# Patient Record
Sex: Female | Born: 1935 | Race: White | Hispanic: No | Smoking: Never smoker
Health system: Southern US, Community
[De-identification: ages and names within clinical notes are randomized; demographics above are authoritative.]

## PROBLEM LIST (undated history)

## (undated) DIAGNOSIS — I341 Nonrheumatic mitral (valve) prolapse: Secondary | ICD-10-CM

## (undated) DIAGNOSIS — D649 Anemia, unspecified: Secondary | ICD-10-CM

## (undated) DIAGNOSIS — M1990 Unspecified osteoarthritis, unspecified site: Secondary | ICD-10-CM

## (undated) DIAGNOSIS — I499 Cardiac arrhythmia, unspecified: Secondary | ICD-10-CM

## (undated) HISTORY — PX: TONSILLECTOMY: SUR1361

## (undated) HISTORY — PX: TUBAL LIGATION: SHX77

## (undated) HISTORY — PX: APPENDECTOMY: SHX54

## (undated) HISTORY — PX: BUNIONECTOMY: SHX129

## (undated) HISTORY — PX: EYE SURGERY: SHX253

## (undated) HISTORY — PX: TRANSTHORACIC ECHOCARDIOGRAM: SHX275

---

## 2017-09-30 ENCOUNTER — Emergency Department (HOSPITAL_COMMUNITY)
Admission: EM | Admit: 2017-09-30 | Discharge: 2017-10-01 | Disposition: A | Discharge status: Home / Self Care | Payer: Medicare Other | Attending: Emergency Medicine | Admitting: Emergency Medicine

## 2017-09-30 ENCOUNTER — Other Ambulatory Visit: Payer: Self-pay

## 2017-09-30 DIAGNOSIS — R748 Abnormal levels of other serum enzymes: Secondary | ICD-10-CM | POA: Insufficient documentation

## 2017-09-30 DIAGNOSIS — R3129 Other microscopic hematuria: Secondary | ICD-10-CM | POA: Diagnosis not present

## 2017-09-30 DIAGNOSIS — R1011 Right upper quadrant pain: Secondary | ICD-10-CM

## 2017-09-30 DIAGNOSIS — Z79899 Other long term (current) drug therapy: Secondary | ICD-10-CM | POA: Diagnosis not present

## 2017-09-30 LAB — CBC
HEMATOCRIT: 38.6 % (ref 36.0–46.0)
HEMOGLOBIN: 12.5 g/dL (ref 12.0–15.0)
MCH: 29.6 pg (ref 26.0–34.0)
MCHC: 32.4 g/dL (ref 30.0–36.0)
MCV: 91.3 fL (ref 78.0–100.0)
Platelets: 333 10*3/uL (ref 150–400)
RBC: 4.23 MIL/uL (ref 3.87–5.11)
RDW: 13.7 % (ref 11.5–15.5)
WBC: 8 10*3/uL (ref 4.0–10.5)

## 2017-09-30 MED ORDER — ONDANSETRON 4 MG PO TBDP
4.0000 mg | ORAL_TABLET | Freq: Once | ORAL | Status: AC | PRN
  Administered 2017-09-30: 4 mg via ORAL
  Filled 2017-09-30: qty 1

## 2017-09-30 NOTE — ED Triage Notes (Signed)
Patient c/o abdominal pain, N/V since this evening. Diffuse abdominal pain, last BM today.

## 2017-09-30 NOTE — ED Notes (Signed)
Pt unable to void at this time. 

## 2017-10-01 ENCOUNTER — Emergency Department (HOSPITAL_COMMUNITY): Payer: Medicare Other

## 2017-10-01 DIAGNOSIS — R1011 Right upper quadrant pain: Secondary | ICD-10-CM | POA: Diagnosis not present

## 2017-10-01 LAB — URINALYSIS, ROUTINE W REFLEX MICROSCOPIC
BILIRUBIN URINE: NEGATIVE
Bacteria, UA: NONE SEEN
Glucose, UA: NEGATIVE mg/dL
Ketones, ur: 5 mg/dL — AB
LEUKOCYTES UA: NEGATIVE
Nitrite: NEGATIVE
Protein, ur: 30 mg/dL — AB
Specific Gravity, Urine: 1.023 (ref 1.005–1.030)
pH: 5 (ref 5.0–8.0)

## 2017-10-01 LAB — COMPREHENSIVE METABOLIC PANEL
ALBUMIN: 4.1 g/dL (ref 3.5–5.0)
ALT: 21 U/L (ref 0–44)
ANION GAP: 12 (ref 5–15)
AST: 34 U/L (ref 15–41)
Alkaline Phosphatase: 49 U/L (ref 38–126)
BUN: 22 mg/dL (ref 8–23)
CO2: 24 mmol/L (ref 22–32)
Calcium: 9.5 mg/dL (ref 8.9–10.3)
Chloride: 103 mmol/L (ref 98–111)
Creatinine, Ser: 0.8 mg/dL (ref 0.44–1.00)
GFR calc Af Amer: >60 mL/min (ref >60)
GFR calc non Af Amer: >60 mL/min (ref >60)
GLUCOSE: 132 mg/dL — AB (ref 70–99)
POTASSIUM: 3.5 mmol/L (ref 3.5–5.1)
SODIUM: 139 mmol/L (ref 135–145)
Total Bilirubin: 0.9 mg/dL (ref 0.3–1.2)
Total Protein: 7.1 g/dL (ref 6.5–8.1)

## 2017-10-01 LAB — LIPASE, BLOOD: Lipase: 70 U/L — ABNORMAL HIGH (ref 11–51)

## 2017-10-01 MED ORDER — TRAMADOL HCL 50 MG PO TABS: 50.0000 mg | ORAL_TABLET | Freq: Four times a day (QID) | ORAL | 0 refills | Status: AC | PRN

## 2017-10-01 MED ORDER — METOPROLOL TARTRATE 5 MG/5ML IV SOLN
2.5000 mg | Freq: Once | INTRAVENOUS | Status: AC
  Administered 2017-10-01: 2.5 mg via INTRAVENOUS
  Filled 2017-10-01: qty 5

## 2017-10-01 MED ORDER — SODIUM CHLORIDE 0.9 % IV BOLUS
1000.0000 mL | Freq: Once | INTRAVENOUS | Status: AC
  Administered 2017-10-01: 1000 mL via INTRAVENOUS

## 2017-10-01 MED ORDER — HYDROCODONE-ACETAMINOPHEN 5-325 MG PO TABS: 1.0000 | ORAL_TABLET | ORAL | 0 refills | Status: AC | PRN

## 2017-10-01 MED ORDER — MORPHINE SULFATE (PF) 4 MG/ML IV SOLN
2.0000 mg | INTRAVENOUS | Status: DC | PRN
  Administered 2017-10-01 (×2): 2 mg via INTRAVENOUS
  Filled 2017-10-01 (×2): qty 1

## 2017-10-01 MED ORDER — ONDANSETRON HCL 4 MG PO TABS: 4.0000 mg | ORAL_TABLET | Freq: Four times a day (QID) | ORAL | 0 refills | Status: AC | PRN

## 2017-10-01 MED ORDER — TRAMADOL HCL 50 MG PO TABS: 50.0000 mg | ORAL_TABLET | Freq: Four times a day (QID) | ORAL | 0 refills | Status: DC | PRN

## 2017-10-01 NOTE — ED Notes (Signed)
Patient's daughter came up to the information desk stating patient's pain is now making her very dizzy. Let patient know that she should be next to get a room.

## 2017-10-01 NOTE — Discharge Instructions (Addendum)
Your ultrasound did not show any evidence of gallstones. It is possible that your pain was from a gallstone that passed, or from a gallbladder that is not working properly. Please follow up with the gastroenterologist for further evaluation. In the mean time, stay on a low fat diet. Return to the Emergency Department if you are having pain or nausea which are not being adequately controlled by the medications, or if you start running a fever.  Follow up with your doctor at home to evaluate the blood in your urine.

## 2017-10-01 NOTE — ED Notes (Signed)
ED Provider at bedside. 

## 2017-10-01 NOTE — ED Provider Notes (Signed)
[facility] EMERGENCY DEPARTMENT Provider Note   CSN: [id number] Arrival date & time: 09/30/17  2323     History   Chief Complaint Chief Complaint  Patient presents with  . Abdominal Pain    HPI Yolanda Hester is a 84 y.o. female.  The history is provided by the patient.  She has a history of mitral valve prolapse and possible history of gallbladder disease and comes in with generalized abdominal pain which started this evening after eating chips and pimento cheese.  Pain does radiate to the back.  She rates it at 9/10.  There is associated nausea and she has vomited twice.  Pain has subsided, but seems to be coming back now.  She had a similar episode in December and was supposed to have ultrasound done, but it has not been done to this point.  She denies any prior problems with fatty food intolerance.  Tonight, she has not noticed anything that makes pain better or worse.  She denies fever or chills.  She denies urinary difficulty.  She denies constipation or diarrhea.  No past medical history on file.  There are no active problems to display for this patient.   ** The histories are not reviewed yet. Please review them in the "History" navigator section and refresh this SmartLink.   OB History   None      Home Medications    Prior to Admission medications   Medication Sig Start Date End Date Taking? Authorizing Provider  metoprolol tartrate (LOPRESSOR) 25 MG tablet Take 12.5 mg by mouth every evening.    [provider]    Family History No family history on file.  Social History Social History   Tobacco Use  . Smoking status: Not on file  Substance Use Topics  . Alcohol use: Not on file  . Drug use: Not on file     Allergies   Aspirin; Codeine; Nsaids; and Prednisone   Review of Systems Review of Systems  All other systems reviewed and are negative.    Physical Exam Updated Vital Signs BP (!) 144/58   Pulse (!) 53    Temp 98.7 F (37.1 C) (Oral)   Resp (!) 22   Ht 5' 3.5" (1.613 m)   Wt 50.8 kg (112 lb)   SpO2 99%   BMI 19.53 kg/m   Physical Exam  Nursing note and vitals reviewed.  84 year old female, resting comfortably and in no acute distress. Vital signs are significant for elevated systolic blood pressure and slightly slow heart rate. Oxygen saturation is 99%, which is normal. Head is normocephalic and atraumatic. PERRLA, EOMI. Oropharynx is clear. Neck is nontender and supple without adenopathy or JVD. Back is nontender and there is no CVA tenderness. Lungs are clear without rales, wheezes, or rhonchi. Chest is nontender. Heart has regular rate and rhythm without murmur. Abdomen is soft, flat, with moderate right upper quadrant tenderness.  Negative Murphy sign.  There is no rebound or guarding.  There are no masses or hepatosplenomegaly and peristalsis is hypoactive. Extremities have no cyanosis or edema, full range of motion is present. Skin is warm and dry without rash. Neurologic: Mental status is normal, cranial nerves are intact, there are no motor or sensory deficits.  ED Treatments / Results  Labs (all labs ordered are listed, but only abnormal results are displayed) Labs Reviewed  LIPASE, BLOOD - Abnormal; Notable for the following components:      Result Value   Lipase  70 (*)    All other components within normal limits  COMPREHENSIVE METABOLIC PANEL - Abnormal; Notable for the following components:   Glucose, Bld 132 (*)    All other components within normal limits  URINALYSIS, ROUTINE W REFLEX MICROSCOPIC - Abnormal; Notable for the following components:   Hgb urine dipstick LARGE (*)    Ketones, ur 5 (*)    Protein, ur 30 (*)    All other components within normal limits  CBC   Radiology [place] Abdomen Complete  Result Date: 10/01/2017 CLINICAL DATA:  84 year old female with abdominal pain, nausea vomiting. EXAM: ABDOMEN ULTRASOUND COMPLETE COMPARISON:  None. FINDINGS:  Gallbladder: No gallstones or wall thickening visualized. No sonographic Murphy sign noted by sonographer. Common bile duct: Diameter: 4 mm Liver: Unremarkable as visualized. Portal vein is patent on color Doppler imaging with normal direction of blood flow towards the liver. IVC: No abnormality visualized. Pancreas: Visualized portion unremarkable. Spleen: Size and appearance within normal limits. Right Kidney: Length: 9.0 cm. Normal echogenicity. No hydronephrosis or shadowing stone. Left Kidney: Length: 9.0 cm. Normal echogenicity. No hydronephrosis or shadowing stone. Abdominal aorta: Ectatic aorta measuring 2.3 cm in diameter. There is atherosclerotic calcification of the abdominal aorta. No aneurysmal dilatation. Other findings: Small perihepatic free fluid. IMPRESSION: 1. Small perihepatic free fluid 2. No gallstone. 3.  Aortic Atherosclerosis (ICD10-I70.0). Electronically Signed   By: Elgie CollardArash  Radparvar M.D.   On: 10/01/2017 04:31    Procedures Procedures   Medications Ordered in ED Medications  ondansetron (ZOFRAN-ODT) disintegrating tablet 4 mg (4 mg Oral Given 09/30/17 2338)     Initial Impression / Assessment and Plan / ED Course  I have reviewed the triage vital signs and the nursing notes.  Pertinent labs & imaging results that were available during my care of the patient were reviewed by me and considered in my medical decision making (see chart for details).  Abdominal pain strongly suggestive of biliary colic-especially given similar episode 7 months ago.  Laboratory work-up does show mild elevation of lipase but with normal transaminases.  Old records are reviewed, and care everywhere does show markedly elevated lipase on March 20, 2017, which had fallen to 276 on January 2.  Mild to moderate elevation of transaminases was also present but with normal alkaline phosphatase.  Unfortunately, physician notes from this time are not found on care everywhere.  This is strongly suggestive  of gallstone pancreatitis.  She will be sent for gallbladder ultrasound.  Other labs do show microscopic hematuria, so will do complete abdominal ultrasound rather than limited.  She does relate prior history of microscopic hematuria which had resolved.  Urinalysis on March 20, 2017 did have moderate amount of blood with 5-10 RBCs.  Ultrasound does not show evidence of cholelithiasis.  With elevated lipase, pain might of been from a gallstone which spontaneously passed, also consider possibility of gallbladder dysfunction.  She will need further outpatient work-up.  She feels considerably better following morphine and ondansetron given in the ED.  She is referred to gastroenterology for further outpatient work-up-consider HIDA scan.  Hematuria can be worked up by her primary care physician when [place] to [place] in [place].  She is discharged with prescriptions for hydrocodone-acetaminophen and ondansetron, return precautions discussed.  Final Clinical Impressions(s) / ED Diagnoses   Final diagnoses:  RUQ pain  Elevated lipase  Microscopic hematuria    ED Discharge Orders        Ordered    HYDROcodone-acetaminophen (NORCO) 5-325 MG tablet  Every  4 hours PRN     10/01/17 0714    ondansetron (ZOFRAN) 4 MG tablet  Every 6 hours PRN     10/01/17 0714       Dione Booze, MD 10/01/17 [phone]

## 2017-10-05 ENCOUNTER — Other Ambulatory Visit (HOSPITAL_COMMUNITY): Payer: Self-pay | Admitting: Gastroenterology

## 2017-10-05 DIAGNOSIS — R1084 Generalized abdominal pain: Secondary | ICD-10-CM

## 2017-10-05 DIAGNOSIS — R112 Nausea with vomiting, unspecified: Secondary | ICD-10-CM

## 2017-10-17 ENCOUNTER — Ambulatory Visit (HOSPITAL_COMMUNITY)
Admission: RE | Admit: 2017-10-17 | Discharge: 2017-10-17 | Disposition: A | Discharge status: Home / Self Care | Payer: Medicare Other | Source: Ambulatory Visit | Attending: Gastroenterology | Admitting: Gastroenterology

## 2017-10-17 ENCOUNTER — Ambulatory Visit (HOSPITAL_COMMUNITY): Payer: Medicare Other

## 2017-10-17 DIAGNOSIS — R112 Nausea with vomiting, unspecified: Secondary | ICD-10-CM | POA: Diagnosis present

## 2017-10-17 MED ORDER — TECHNETIUM TC 99M MEBROFENIN IV KIT
5.0000 | PACK | Freq: Once | INTRAVENOUS | Status: AC | PRN
  Administered 2017-10-17: 5 via INTRAVENOUS

## 2017-10-17 MED ORDER — BARIUM SULFATE 2.1 % PO SUSP
ORAL | Status: AC
  Filled 2017-10-17: qty 2

## 2017-10-18 ENCOUNTER — Ambulatory Visit (HOSPITAL_COMMUNITY)
Admission: RE | Admit: 2017-10-18 | Discharge: 2017-10-18 | Disposition: A | Discharge status: Home / Self Care | Payer: Medicare Other | Source: Ambulatory Visit | Attending: Gastroenterology | Admitting: Gastroenterology

## 2017-10-18 ENCOUNTER — Encounter (HOSPITAL_COMMUNITY): Payer: Self-pay

## 2017-10-18 DIAGNOSIS — I700 Atherosclerosis of aorta: Secondary | ICD-10-CM | POA: Insufficient documentation

## 2017-10-18 DIAGNOSIS — R1084 Generalized abdominal pain: Secondary | ICD-10-CM | POA: Insufficient documentation

## 2017-10-18 MED ORDER — IOHEXOL 300 MG/ML  SOLN
100.0000 mL | Freq: Once | INTRAMUSCULAR | Status: AC | PRN
  Administered 2017-10-18: 100 mL via INTRAVENOUS

## 2017-10-19 ENCOUNTER — Ambulatory Visit: Payer: Self-pay | Admitting: Surgery

## 2017-10-19 NOTE — H&P (View-Only) (Signed)
Yolanda Hester Documented: 10/19/2017 10:49 AM Location: [facility] Patient #: [id number] DOB: 10/27/1932 Widowed / Language: English / Race: White Female  History of Present Illness (Emmit Oriley A. Peggy Monk MD; 10/19/2017 12:14 PM) Patient words: 84-year-old healthy woman referred for history of reported gallstone pancreatitis. This occurred in December 2018. She had been admitted to the hospital with a lipase of 42,000. Had a CT scan 12/31 (describes nonspecific periportal edema, distended gallbladder with minimal pericholecystic fluid, it notes a 2 mm gallstone and moderate biliary dilatation with a common bile duct measuring 10 mm. Pancreatic duct was prominent measuring 6 mm in the body and tail) as well as an MRCP 03/21/17 (no filling defect in the biliary ducts, common bile duct at that time was 8 mm in size but the pancreatic duct was normal in caliber. There was no cholelithiasis present on the MRCP at that time) without any filling defect or pancreatic mass noted.   She did not follow-up as her symptoms resolved very quickly, her lipase went down to 276 nearly overnight.   Apparently a couple weeks ago she started noting severe upper abdominal pain/ pressure associated with bloating, nausea and nonbloody nonbilious emesis. No fever Reported to the emergency department a couple weeks ago and labs were normal except for a lipase of 70. Ultrasound demonstrated no gallstones or biliary dilatation. She was then referred to see Dr. Brahmbhatt who she saw on July 18 with ongoing abdominal discomfort and nausea and vomiting. He recommended repeating CT scan as well as performing a HIDA scan. Also recommended stool softeners, she did take some Senna tea and [place]ley when she had a bowel movement much of her pain was resolved. She is referred here given her history of gallstone pancreatitis.   Ultrasound on July 14 shows small perihepatic free fluid, no gallstone, aortic  atherosclerosis. HIDA scan performed on July 30 shows normal gallbladder ejection fraction 65%. CT scan performed July 31 demonstrates mild intra-and extrahepatic biliary duct dilatation which appears increased from comparison ultrasound, common duct measured 8-10 mm compared to 4 mm on the ultrasound 2 weeks prior. No lesions identified. Gallbladder not distended, no gallstones evident, several very small liver cysts. Pancreatic duct dilated to 6 mm which extends to the ampullary region. No evident and clear lesion. No retroperitoneal or periportal lymphadenopathy; this is actually essentially the same as the measurements on her imaging in January.  She is visiting her daughter here from [place]. 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  The patient is a 84 year old female.   Past Surgical History (Armen Ferguson, CMA; 10/19/2017 10:49 AM) Appendectomy Cataract Surgery Bilateral. Foot Surgery Bilateral. Tonsillectomy  Diagnostic Studies History (Armen Ferguson, CMA; 10/19/2017 10:49 AM) Colonoscopy 1-5 years ago Mammogram within last year Pap Smear 1-5 years ago  Allergies (Armen Ferguson, CMA; 10/19/2017 10:51 AM) CODEINE ASPIRIN Hives. NSAIDs PREDNISONE Palpitations  Medication History (Armen Ferguson, CMA; 10/19/2017 10:54 AM) Metoprolol Succinate ER (25MG Tablet ER 24HR, Oral) Active. Biotin (1000MCG Tablet Chewable, Oral) Active. Centrum Silver (Oral) Active. Zofran (4MG Tablet, Oral) Active. Vitamin B-12 (1000MCG Tablet, Oral) Active. Multivitamin Adult (Oral) Active. Medications Reconciled  Social History (Armen Ferguson, CMA; 10/19/2017 10:49 AM) Alcohol use Moderate alcohol use. Caffeine use Coffee, Tea. No drug use Tobacco use Never smoker.  Family History (Armen Ferguson, CMA; 10/19/2017 10:49 AM) Arthritis Mother. Cerebrovascular Accident Father,  Mother. Heart Disease Mother. Heart disease in   female family member before age 65 Hypertension Mother.  Pregnancy / Birth History (Armen Ferguson, CMA; 10/19/2017 10:49 AM) Age at menarche 15 years. Age of menopause 46-50 Length (months) of breastfeeding 7-12 Maternal age 26-30 Para 2  Other Problems (Armen Ferguson, CMA; 10/19/2017 10:49 AM) Arthritis Diverticulosis Hypercholesterolemia Pancreatitis     Review of Systems (Julianah Marciel A. Orlo Brickle MD; 10/19/2017 12:11 PM) General Not Present- Appetite Loss, Chills, Fatigue, Fever, Night Sweats, Weight Gain and Weight Loss. Skin Not Present- Change in Wart/Mole, Dryness, Hives, Jaundice, New Lesions, Non-Healing Wounds, Rash and Ulcer. HEENT Not Present- Earache, Hearing Loss, Hoarseness, Nose Bleed, Oral Ulcers, Ringing in the Ears, Seasonal Allergies, Sinus Pain, Sore Throat, Visual Disturbances, Wears glasses/contact lenses and Yellow Eyes. Respiratory Not Present- Bloody sputum, Chronic Cough, Difficulty Breathing, Snoring and Wheezing. Breast Not Present- Breast Mass, Breast Pain, Nipple Discharge and Skin Changes. Cardiovascular Not Present- Chest Pain, Difficulty Breathing Lying Down, Leg Cramps, Palpitations, Rapid Heart Rate, Shortness of Breath and Swelling of Extremities. Gastrointestinal Not Present- Abdominal Pain, Bloating, Bloody Stool, Change in Bowel Habits, Chronic diarrhea, Constipation, Difficulty Swallowing, Excessive gas, Gets full quickly at meals, Hemorrhoids, Indigestion, Nausea, Rectal Pain and Vomiting. Female Genitourinary Not Present- Frequency, Nocturia, Painful Urination, Pelvic Pain and Urgency. Musculoskeletal Present- Joint Pain. Not Present- Back Pain, Joint Stiffness, Muscle Pain, Muscle Weakness and Swelling of Extremities. Neurological Not Present- Decreased Memory, Fainting, Headaches, Numbness, Seizures, Tingling, Tremor, Trouble walking and Weakness. Psychiatric Not Present- Anxiety, Bipolar,  Change in Sleep Pattern, Depression, Fearful and Frequent crying. Endocrine Present- Cold Intolerance and Heat Intolerance. Not Present- Excessive Hunger, Hair Changes, Hot flashes and New Diabetes. All other systems negative  Vitals (Armen Ferguson CMA; 10/19/2017 10:50 AM) 10/19/2017 10:49 AM Weight: 104.5 lb Height: 63.5in Body Surface Area: 1.48 m Body Mass Index: 18.22 kg/m  Temp.: 97.6F  Pulse: 85 (Regular)  P.OX: 99% (Room air) BP: 130/86 (Sitting, Left Arm, Standard)      Physical Exam (Charliene Inoue A. Mackinze Criado MD; 10/19/2017 12:11 PM)  The physical exam findings are as follows: Note:Gen: alert and well appearing Eye: extraocular motion intact, no scleral icterus ENT: moist mucus membranes, dentition intact Neck: no mass or thyromegaly Chest: unlabored respirations, symmetrical air entry, clear bilaterally CV: regular rate and rhythm, no pedal edema Abdomen: soft, nontender, nondistended. No mass or organomegaly. Well-healed right lower quadrant scar from prior appendectomy, no visible scar the umbilicus from tubal ligation. MSK: strength symmetrical throughout, no deformity Neuro: grossly intact, normal gait Psych: normal mood and affect, appropriate insight Skin: warm and dry, no rash or lesion on limited exam    Assessment & Plan (Lani Mendiola A. Jolan Mealor MD; 10/19/2017 12:13 PM)  GALLSTONE PANCREATITIS (K85.10) Story: At this point a remote history. Unclear if her recent bout of pain was related to biliary colic or constipation. However given the history of gallstones and pancreatitis, I recommend proceeding with laparoscopic cholecystectomy with possible cholangiogram. Discussed risks of surgery including bleeding, pain, scarring, intraabdominal injury specifically to the common bile duct and sequelae, conversion to open surgery, failure to resolve symptoms, blood clots/ pulmonary embolus, heart attack, pneumonia, stroke, death. If she continues to have symptoms post  cholecystectomy, given the mild dilation or pancreatic duct, it may be worth considering ERCP/EUS to evaluate for an occult papillary lesion although given the stability of the ductal dilatation over the last 8 months and the very acute onset and recovery of her pancreatitis last year, I think that that is less likely. Questions welcomed and answered to patient's satisfaction.   She is planned to return to [place] where she lives in about 3 weeks. I discussed with her that if she would prefer to arrange to see a surgeon there and so that she could follow up reliably that that might be the best course of action. However she would like to have surgery here while she is staying with her daughter.  

## 2017-10-19 NOTE — H&P (Signed)
Ruben Gottron Nez Documented: 10/19/2017 10:49 AM Location: [facility] Patient #: [id number] DOB: June 04, 1932 Widowed / Language: [place] / Race: White Female  History of Present Illness (Benen Weida A. Fredricka Bonine MD; 10/19/2017 12:14 PM) Patient words: 84 year old healthy woman referred for history of reported gallstone pancreatitis. This occurred in December 2018. She had been admitted to the hospital with a lipase of 42,000. Had a CT scan 12/31 (describes nonspecific periportal edema, distended gallbladder with minimal pericholecystic fluid, it notes a 2 mm gallstone and moderate biliary dilatation with a common bile duct measuring 10 mm. Pancreatic duct was prominent measuring 6 mm in the body and tail) as well as an MRCP 03/21/17 (no filling defect in the biliary ducts, common bile duct at that time was 8 mm in size but the pancreatic duct was normal in caliber. There was no cholelithiasis present on the MRCP at that time) without any filling defect or pancreatic mass noted.   She did not follow-up as her symptoms resolved very quickly, her lipase went down to 276 nearly overnight.   Apparently a couple weeks ago she started noting severe upper abdominal pain/ pressure associated with bloating, nausea and nonbloody nonbilious emesis. No fever Reported to the emergency department a couple weeks ago and labs were normal except for a lipase of 70. Ultrasound demonstrated no gallstones or biliary dilatation. She was then referred to see Dr. Levora Angel who she saw on July 18 with ongoing abdominal discomfort and nausea and vomiting. He recommended repeating CT scan as well as performing a HIDA scan. Also recommended stool softeners, she did take some Senna tea and Exley when she had a bowel movement much of her pain was resolved. She is referred here given her history of gallstone pancreatitis.   Ultrasound on July 14 shows small perihepatic free fluid, no gallstone, aortic  atherosclerosis. HIDA scan performed on July 30 shows normal gallbladder ejection fraction 65%. CT scan performed July 31 demonstrates mild intra-and extrahepatic biliary duct dilatation which appears increased from comparison ultrasound, common duct measured 8-10 mm compared to 4 mm on the ultrasound 2 weeks prior. No lesions identified. Gallbladder not distended, no gallstones evident, several very small liver cysts. Pancreatic duct dilated to 6 mm which extends to the ampullary region. No evident and clear lesion. No retroperitoneal or periportal lymphadenopathy; this is actually essentially the same as the measurements on her imaging in January.  She is visiting her daughter here from [place]. She placed the flute and her daughter is a Engineer, agricultural at [employer]; they play gigs for retirement communities in town.  The patient is a 84 year old female.   Past Surgical History Renee Ramus, CMA; 10/19/2017 10:49 AM) Appendectomy Cataract Surgery Bilateral. Foot Surgery Bilateral. Tonsillectomy  Diagnostic Studies History (Armen Ferguson, CMA; 10/19/2017 10:49 AM) Colonoscopy 1-5 years ago Mammogram within last year Pap Smear 1-5 years ago  Allergies Renee Ramus, CMA; 10/19/2017 10:51 AM) CODEINE ASPIRIN Hives. NSAIDs PREDNISONE Palpitations  Medication History (Armen Ferguson, CMA; 10/19/2017 10:54 AM) Metoprolol Succinate ER (25MG  Tablet ER 24HR, Oral) Active. Biotin ( Tablet Chewable, Oral) Active. Centrum Silver (Oral) Active. Zofran (4MG  Tablet, Oral) Active. Vitamin B-12 ( Tablet, Oral) Active. Multivitamin Adult (Oral) Active. Medications Reconciled  Social History Renee Ramus, CMA; 10/19/2017 10:49 AM) Alcohol use Moderate alcohol use. Caffeine use Coffee, Tea. No drug use Tobacco use Never smoker.  Family History Renee Ramus, CMA; 10/19/2017 10:49 AM) Arthritis Mother. Cerebrovascular Accident Father,  Mother. Heart Disease Mother. Heart disease in  female family member before age 29 Hypertension Mother.  Pregnancy / Birth History Renee Ramus, CMA; 10/19/2017 10:49 AM) Age at menarche 15 years. Age of menopause [age] Length (months) of breastfeeding 7-12 Maternal age 58-30 Para 2  Other Problems Renee Ramus, CMA; 10/19/2017 10:49 AM) Arthritis Diverticulosis Hypercholesterolemia Pancreatitis     Review of Systems (Zaiah Eckerson A. Fredricka Bonine MD; 10/19/2017 12:11 PM) General Not Present- Appetite Loss, Chills, Fatigue, Fever, Night Sweats, Weight Gain and Weight Loss. Skin Not Present- Change in Wart/Mole, Dryness, Hives, Jaundice, New Lesions, Non-Healing Wounds, Rash and Ulcer. HEENT Not Present- Earache, Hearing Loss, Hoarseness, Nose Bleed, Oral Ulcers, Ringing in the Ears, Seasonal Allergies, Sinus Pain, Sore Throat, Visual Disturbances, Wears glasses/contact lenses and Yellow Eyes. Respiratory Not Present- Bloody sputum, Chronic Cough, Difficulty Breathing, Snoring and Wheezing. Breast Not Present- Breast Mass, Breast Pain, Nipple Discharge and Skin Changes. Cardiovascular Not Present- Chest Pain, Difficulty Breathing Lying Down, Leg Cramps, Palpitations, Rapid Heart Rate, Shortness of Breath and Swelling of Extremities. Gastrointestinal Not Present- Abdominal Pain, Bloating, Bloody Stool, Change in Bowel Habits, Chronic diarrhea, Constipation, Difficulty Swallowing, Excessive gas, Gets full quickly at meals, Hemorrhoids, Indigestion, Nausea, Rectal Pain and Vomiting. Female Genitourinary Not Present- Frequency, Nocturia, Painful Urination, Pelvic Pain and Urgency. Musculoskeletal Present- Joint Pain. Not Present- Back Pain, Joint Stiffness, Muscle Pain, Muscle Weakness and Swelling of Extremities. Neurological Not Present- Decreased Memory, Fainting, Headaches, Numbness, Seizures, Tingling, Tremor, Trouble walking and Weakness. Psychiatric Not Present- Anxiety, Bipolar,  Change in Sleep Pattern, Depression, Fearful and Frequent crying. Endocrine Present- Cold Intolerance and Heat Intolerance. Not Present- Excessive Hunger, Hair Changes, Hot flashes and New Diabetes. All other systems negative  Vitals (Armen Ferguson CMA; 10/19/2017 10:50 AM) 10/19/2017 10:49 AM Weight: 104.5 lb Height: 63.5in Body Surface Area: 1.48 m Body Mass Index: 18.22 kg/m  Temp.: 97.45F  Pulse: 85 (Regular)  P.OX: 99% (Room air) BP: 130/86 (Sitting, Left Arm, Standard)      Physical Exam (Izac Faulkenberry A. Fredricka Bonine MD; 10/19/2017 12:11 PM)  The physical exam findings are as follows: Note:Gen: alert and well appearing Eye: extraocular motion intact, no scleral icterus ENT: moist mucus membranes, dentition intact Neck: no mass or thyromegaly Chest: unlabored respirations, symmetrical air entry, clear bilaterally CV: regular rate and rhythm, no pedal edema Abdomen: soft, nontender, nondistended. No mass or organomegaly. Well-healed right lower quadrant scar from prior appendectomy, no visible scar the umbilicus from tubal ligation. MSK: strength symmetrical throughout, no deformity Neuro: grossly intact, normal gait Psych: normal mood and affect, appropriate insight Skin: warm and dry, no rash or lesion on limited exam    Assessment & Plan (Denetta Fei A. Fredricka Bonine MD; 10/19/2017 12:13 PM)  GALLSTONE PANCREATITIS (K85.10) Story: At this point a remote history. Unclear if her recent bout of pain was related to biliary colic or constipation. However given the history of gallstones and pancreatitis, I recommend proceeding with laparoscopic cholecystectomy with possible cholangiogram. Discussed risks of surgery including bleeding, pain, scarring, intraabdominal injury specifically to the common bile duct and sequelae, conversion to open surgery, failure to resolve symptoms, blood clots/ pulmonary embolus, heart attack, pneumonia, stroke, death. If she continues to have symptoms post  cholecystectomy, given the mild dilation or pancreatic duct, it may be worth considering ERCP/EUS to evaluate for an occult papillary lesion although given the stability of the ductal dilatation over the last 8 months and the very acute onset and recovery of her pancreatitis last year, I think that that is less likely. Questions welcomed and answered to patient's satisfaction.  She is planned to return to [place] where she lives in about 3 weeks. I discussed with her that if she would prefer to arrange to see a surgeon there and so that she could follow up reliably that that might be the best course of action. However she would like to have surgery here while she is staying with her daughter.

## 2017-10-27 ENCOUNTER — Other Ambulatory Visit: Payer: Self-pay

## 2017-10-27 ENCOUNTER — Encounter (HOSPITAL_COMMUNITY): Payer: Self-pay

## 2017-10-27 ENCOUNTER — Encounter (HOSPITAL_COMMUNITY)
Admission: RE | Admit: 2017-10-27 | Discharge: 2017-10-27 | Disposition: A | Discharge status: Home / Self Care | Payer: Medicare Other | Source: Ambulatory Visit | Attending: Surgery | Admitting: Surgery

## 2017-10-27 DIAGNOSIS — Z01812 Encounter for preprocedural laboratory examination: Secondary | ICD-10-CM | POA: Diagnosis present

## 2017-10-27 HISTORY — DX: Anemia, unspecified: D64.9

## 2017-10-27 HISTORY — DX: Unspecified osteoarthritis, unspecified site: M19.90

## 2017-10-27 HISTORY — DX: Cardiac arrhythmia, unspecified: I49.9

## 2017-10-27 HISTORY — DX: Nonrheumatic mitral (valve) prolapse: I34.1

## 2017-10-27 LAB — CBC WITH DIFFERENTIAL/PLATELET
ABS IMMATURE GRANULOCYTES: 0 10*3/uL (ref 0.0–0.1)
BASOS PCT: 1 %
Basophils Absolute: 0.1 10*3/uL (ref 0.0–0.1)
Eosinophils Absolute: 0.1 10*3/uL (ref 0.0–0.7)
Eosinophils Relative: 3 %
HEMATOCRIT: 37.5 % (ref 36.0–46.0)
Hemoglobin: 11.9 g/dL — ABNORMAL LOW (ref 12.0–15.0)
IMMATURE GRANULOCYTES: 0 %
LYMPHS ABS: 1.5 10*3/uL (ref 0.7–4.0)
Lymphocytes Relative: 32 %
MCH: 29.8 pg (ref 26.0–34.0)
MCHC: 31.7 g/dL (ref 30.0–36.0)
MCV: 93.8 fL (ref 78.0–100.0)
Monocytes Absolute: 0.6 10*3/uL (ref 0.1–1.0)
Monocytes Relative: 13 %
NEUTROS ABS: 2.3 10*3/uL (ref 1.7–7.7)
NEUTROS PCT: 51 %
PLATELETS: 315 10*3/uL (ref 150–400)
RBC: 4 MIL/uL (ref 3.87–5.11)
RDW: 13.7 % (ref 11.5–15.5)
WBC: 4.6 10*3/uL (ref 4.0–10.5)

## 2017-10-27 LAB — COMPREHENSIVE METABOLIC PANEL
ALBUMIN: 3.9 g/dL (ref 3.5–5.0)
ALT: 18 U/L (ref 0–44)
AST: 31 U/L (ref 15–41)
Alkaline Phosphatase: 44 U/L (ref 38–126)
Anion gap: 6 (ref 5–15)
BUN: 15 mg/dL (ref 8–23)
CHLORIDE: 105 mmol/L (ref 98–111)
CO2: 29 mmol/L (ref 22–32)
CREATININE: 1.08 mg/dL — AB (ref 0.44–1.00)
Calcium: 9.5 mg/dL (ref 8.9–10.3)
GFR calc Af Amer: 54 mL/min — ABNORMAL LOW (ref >60)
GFR, EST NON AFRICAN AMERICAN: 46 mL/min — AB (ref >60)
GLUCOSE: 120 mg/dL — AB (ref 70–99)
Potassium: 3.9 mmol/L (ref 3.5–5.1)
Sodium: 140 mmol/L (ref 135–145)
Total Bilirubin: 0.8 mg/dL (ref 0.3–1.2)
Total Protein: 7 g/dL (ref 6.5–8.1)

## 2017-10-27 NOTE — Progress Notes (Signed)
No PCP in system   Pt. Lives in [place] and is here visiting daughter.  Requested EKG tracing from [facility] in [place] ,[place]  [facility] [place]   Dr. Radonna Rickerhomas Hong  Requested last office visit from Dr. Audley HoseHong

## 2017-10-27 NOTE — Pre-Procedure Instructions (Signed)
Yolanda Hester  10/27/2017      [facility] [place] [place] [address] Phone: [phone] Fax: [phone]    Your procedure is scheduled on Wednesday August 14.  Report to [facility] Admitting at 1:00 A.M.  Call this number if you have problems the morning of surgery:  [phone]   Remember:  Do not eat or drink after midnight.    Take these medicines the morning of surgery with A SIP OF WATER:   Metoprolol (lopressor) Ondansetron (Zofran) if needed Tramadol (ultram) if needed OR hydrocodone-acetaminophen (Norco) if needed  7 days prior to surgery STOP taking any Aspirin(unless otherwise instructed by your surgeon), Aleve, Naproxen, Ibuprofen, Motrin, Advil, Goody's, BC's, all herbal medications, fish oil, and all vitamins     Do not wear jewelry, make-up or nail polish.  Do not wear lotions, powders, or perfumes, or deodorant.  Do not shave 48 hours prior to surgery.  Men may shave face and neck.  Do not bring valuables to the hospital.  [facility] is not responsible for any belongings or valuables.  Contacts, dentures or bridgework may not be worn into surgery.  Leave your suitcase in the car.  After surgery it may be brought to your room.  For patients admitted to the hospital, discharge time will be determined by your treatment team.  Patients discharged the day of surgery will not be allowed to drive home.   Special instructions:    [facility]- Preparing For Surgery  Before surgery, you can play an important role. Because skin is not sterile, your skin needs to be as free of germs as possible. You can reduce the number of germs on your skin by washing with CHG (chlorahexidine gluconate) Soap before surgery.  CHG is an antiseptic cleaner which kills germs and bonds with the skin to continue killing germs even after washing.    Oral Hygiene is also important to reduce your  risk of infection.  Remember - BRUSH YOUR TEETH THE MORNING OF SURGERY WITH YOUR REGULAR TOOTHPASTE  Please do not use if you have an allergy to CHG or antibacterial soaps. If your skin becomes reddened/irritated stop using the CHG.  Do not shave (including legs and underarms) for at least 48 hours prior to first CHG shower. It is OK to shave your face.  Please follow these instructions carefully.   1. Shower the NIGHT BEFORE SURGERY and the MORNING OF SURGERY with CHG.   2. If you chose to wash your hair, wash your hair first as usual with your normal shampoo.  3. After you shampoo, rinse your hair and body thoroughly to remove the shampoo.  4. Use CHG as you would any other liquid soap. You can apply CHG directly to the skin and wash gently with a scrungie or a clean washcloth.   5. Apply the CHG Soap to your body ONLY FROM THE NECK DOWN.  Do not use on open wounds or open sores. Avoid contact with your eyes, ears, mouth and genitals (private parts). Wash Face and genitals (private parts)  with your normal soap.  6. Wash thoroughly, paying special attention to the area where your surgery will be performed.  7. Thoroughly rinse your body with warm water from the neck down.  8. DO NOT shower/wash with your normal soap after using and rinsing off the CHG Soap.  9. Pat yourself dry with a CLEAN TOWEL.  10. Wear CLEAN PAJAMAS to bed the night before surgery, wear comfortable clothes the morning of surgery  11. Place CLEAN SHEETS on your bed the night of your first shower and DO NOT SLEEP WITH PETS.    Day of Surgery:  Do not apply any deodorants/lotions.  Please wear clean clothes to the hospital/surgery center.   Remember to brush your teeth WITH YOUR REGULAR TOOTHPASTE.    Please read over the following fact sheets that you were given. Coughing and Deep Breathing and Surgical Site Infection Prevention

## 2017-10-27 NOTE — Progress Notes (Signed)
[facility] requested EKG tracing. Written report in Care Everywhere.

## 2017-10-30 NOTE — Progress Notes (Addendum)
Anesthesia Chart Review:  Case:  [id number] Date/Time:  11/01/17 1445   Procedure:  LAPAROSCOPIC CHOLECYSTECTOMY (N/A )   Anesthesia type:  General   Pre-op diagnosis:  Gallstone pancreatitis   Location:  [facility] OR ROOM 02 / [facility] OR   Surgeon:  Berna Bueonnor, Chelsea A, MD      DISCUSSION: Patient is an 84 year old female scheduled for the above procedure.  History includes   never smoker, MVP, palpitations, anemia, arthritis, gallstone pancreatitis 02/2017, cataract extraction (05/2017, 06/2017). She lives in [place], but is visiting daughter locally and chose to have surgery here.   Cardiologist is Radonna RickerHong, Thomas, MD with [facility] ([address], [place], [place] [phone] or [phone]). Last time seen ~ 03/2017. She believes she had an office visit, EKG, echo, and Holter monitor with one year follow-up recommended. She has had an ETT and nuclear stress test in the past, but think they may be > 3 years ago. Denied cardiac cath. She denied any history of afib.  She reports that she is very active. Since in [place], she continues to walk ~ 1-2 miles per day. When living in [place], she walks even faster on her treadmill. She denied chest pain, syncope, or significant SOB with this level of exercise. She denied edema.   No cardiology records received from initial requests made on 11/06/17. I was able to get more direct cardiology contact information from patient and will re-request records. She does not report any CV or CHF symptoms and has good exercise tolerance. If we don't get an EKG tracing from within the past year then she will need one done on the day of surgery.  ADDENDUM 10/31/17 12:30 PM: 03/20/17 EKG tracing received. Still awaiting other records. Reviewed available information with anesthesiologist Val EagleMoser, Christopher, MD. Without CV symptoms and good exercise tolerance would anticipate that she can proceed as planned, but anesthesiologist to evaluate on the day of surgery. I will also  follow-up record request.  ADDENDUM 11/01/17 12:32 PM: [facility]iology [facility] from [facility]. Last visit with Dr. Audley HoseHong was on 04/12/17 for palpitations and non-rheumatic fever MVP. He ordered an echo and Holter monitor (outlined below). She has MVP with moderate MR.    VS: BP 132/68   Pulse 84   Temp 36.5 C   Resp 20   Ht 5\' 3"  (1.6 m)   Wt 47.9 kg   SpO2 100%   BMI 18.71 kg/m   PROVIDERS: Radonna RickerHong, Thomas, MD is EP cardiologist [facility]). She has only seen him once because tragically her previous cardiologist Blossom HoopsMark, Hausknecht, MD at the same practice was murdered in 2018.  LABS: Labs reviewed: Acceptable for surgery. (all labs ordered are listed, but only abnormal results are displayed)  Labs Reviewed  CBC WITH DIFFERENTIAL/PLATELET - Abnormal; Notable for the following components:      Result Value   Hemoglobin 11.9 (*)    All other components within normal limits  COMPREHENSIVE METABOLIC PANEL - Abnormal; Notable for the following components:   Glucose, Bld 120 (*)    Creatinine, Ser 1.08 (*)    GFR calc non Af Amer 46 (*)    GFR calc Af Amer 54 (*)    All other components within normal limits    EKG: 03/20/17 ([facility]): Result Narrative: Result Narrative  Ventricular Rate 99 BPM Atrial Rate 99 BPM P-R Interval 202 ms QRS Duration 90 ms Q-T Interval 358 ms QTC Calculation(Bazett) 459 ms P Axis 86 degrees R Axis 69  degrees T Axis 69 degrees  Sinus rhythm with Premature atrial complexes Nonspecific ST abnormality Abnormal ECG When compared with ECG of 19-Dec-2014 08:44, Premature atrial complexes are now Present Confirmed by Sherley BoundsBIRNBAUM, MD, Rich ReiningYOCHAI [phone]([id number]) on 03/21/2017 6:31:30 AM    CV:  Echo 04/17/17 [facility]([place] [facility]): Left ventricular ejection fraction is normal and estimated at 55%. Impaired relaxation pattern observed (grade 1). Right ventricular global systolic function is normal. Left atrium mildly  dilated. Mitral valve prolapse seen. Moderate mitral regurgitation.  24 hour Holter Monitor 04/12/17 [facility]([place] [facility]):  Normal sinus rhythm, 51 to 95 bpm.  Rare PVCs.  Frequent premature supraventricular contractions.  4 runs of SVT, longest 13 beats.    Nuclear stress test [facility]([place] [facility]): Normal myocardial perfusion study.  Echo, event monitor, and stress test (if within five years) requested from [facility].   Past Medical History:  Diagnosis Date  . Anemia   . Arthritis   . Dysrhythmia    palpatations  . Mitral valve prolapse     Past Surgical History:  Procedure Laterality Date  . APPENDECTOMY    . BUNIONECTOMY Bilateral   . EYE SURGERY Bilateral    Bilateral cataracts  . TONSILLECTOMY    . TUBAL LIGATION      MEDICATIONS: . HYDROcodone-acetaminophen (NORCO) 5-325 MG tablet  . metoprolol succinate (TOPROL-XL) 25 MG 24 hr tablet  . ondansetron (ZOFRAN) 4 MG tablet  . traMADol (ULTRAM) 50 MG tablet  . Biotin 1000 MCG CHEW  . metoprolol tartrate (LOPRESSOR) 25 MG tablet  . Multiple Vitamin (MULTIVITAMIN WITH MINERALS) TABS tablet   No current facility-administered medications for this encounter.     Velna Ochsllison Heaton Sarin, PA-C [facility]/Anest[facility]iology Phone [phone] 10/30/2017 5:58 PM

## 2017-10-31 MED ORDER — BUPIVACAINE LIPOSOME 1.3 % IJ SUSP
20.0000 mL | INTRAMUSCULAR | Status: AC
  Administered 2017-11-01: 20 mL
  Filled 2017-10-31: qty 20

## 2017-11-01 ENCOUNTER — Ambulatory Visit (HOSPITAL_COMMUNITY): Payer: Medicare Other | Admitting: Vascular Surgery

## 2017-11-01 ENCOUNTER — Ambulatory Visit (HOSPITAL_COMMUNITY)
Admission: RE | Admit: 2017-11-01 | Discharge: 2017-11-01 | Disposition: A | Discharge status: Home / Self Care | Payer: Medicare Other | Source: Ambulatory Visit | Attending: Surgery | Admitting: Surgery

## 2017-11-01 ENCOUNTER — Encounter (HOSPITAL_COMMUNITY): Payer: Self-pay | Admitting: *Deleted

## 2017-11-01 ENCOUNTER — Other Ambulatory Visit: Payer: Self-pay

## 2017-11-01 ENCOUNTER — Encounter (HOSPITAL_COMMUNITY)
Admission: RE | Disposition: A | Discharge status: Home / Self Care | Payer: Self-pay | Source: Ambulatory Visit | Attending: Surgery

## 2017-11-01 ENCOUNTER — Encounter (HOSPITAL_COMMUNITY): Payer: Self-pay

## 2017-11-01 DIAGNOSIS — K811 Chronic cholecystitis: Secondary | ICD-10-CM | POA: Insufficient documentation

## 2017-11-01 DIAGNOSIS — K851 Biliary acute pancreatitis without necrosis or infection: Secondary | ICD-10-CM | POA: Diagnosis present

## 2017-11-01 DIAGNOSIS — Z79899 Other long term (current) drug therapy: Secondary | ICD-10-CM | POA: Diagnosis not present

## 2017-11-01 HISTORY — PX: CHOLECYSTECTOMY: SHX55

## 2017-11-01 SURGERY — LAPAROSCOPIC CHOLECYSTECTOMY
Anesthesia: General | Site: Abdomen

## 2017-11-01 MED ORDER — FENTANYL CITRATE (PF) 250 MCG/5ML IJ SOLN
INTRAMUSCULAR | Status: AC
Start: 2017-11-01 — End: ?
  Filled 2017-11-01: qty 5

## 2017-11-01 MED ORDER — MIDAZOLAM HCL 5 MG/5ML IJ SOLN
INTRAMUSCULAR | Status: DC | PRN
  Administered 2017-11-01: 1 mg via INTRAVENOUS

## 2017-11-01 MED ORDER — CHLORHEXIDINE GLUCONATE 4 % EX LIQD: 60.0000 mL | Freq: Once | CUTANEOUS | Status: DC

## 2017-11-01 MED ORDER — BUPIVACAINE-EPINEPHRINE (PF) 0.25% -1:200000 IJ SOLN
INTRAMUSCULAR | Status: AC
  Filled 2017-11-01: qty 30

## 2017-11-01 MED ORDER — PROPOFOL 10 MG/ML IV BOLUS
INTRAVENOUS | Status: AC
  Filled 2017-11-01: qty 20

## 2017-11-01 MED ORDER — ONDANSETRON HCL 4 MG/2ML IJ SOLN
INTRAMUSCULAR | Status: AC
  Filled 2017-11-01: qty 2

## 2017-11-01 MED ORDER — ACETAMINOPHEN 500 MG PO TABS
1000.0000 mg | ORAL_TABLET | ORAL | Status: AC
  Administered 2017-11-01: 1000 mg via ORAL
  Filled 2017-11-01: qty 2

## 2017-11-01 MED ORDER — FENTANYL CITRATE (PF) 100 MCG/2ML IJ SOLN
INTRAMUSCULAR | Status: DC | PRN
  Administered 2017-11-01 (×2): 50 ug via INTRAVENOUS

## 2017-11-01 MED ORDER — EPHEDRINE 5 MG/ML INJ
INTRAVENOUS | Status: AC
  Filled 2017-11-01: qty 10

## 2017-11-01 MED ORDER — LACTATED RINGERS IV SOLN
INTRAVENOUS | Status: DC
  Administered 2017-11-01: 14:00:00 via INTRAVENOUS

## 2017-11-01 MED ORDER — HEMOSTATIC AGENTS (NO CHARGE) OPTIME
TOPICAL | Status: DC | PRN
  Administered 2017-11-01: 1 via TOPICAL

## 2017-11-01 MED ORDER — MIDAZOLAM HCL 2 MG/2ML IJ SOLN
INTRAMUSCULAR | Status: AC
  Filled 2017-11-01: qty 2

## 2017-11-01 MED ORDER — BUPIVACAINE-EPINEPHRINE 0.25% -1:200000 IJ SOLN
INTRAMUSCULAR | Status: DC | PRN
  Administered 2017-11-01: 9 mL

## 2017-11-01 MED ORDER — SODIUM CHLORIDE 0.9 % IR SOLN
Status: DC | PRN
  Administered 2017-11-01: 1000 mL

## 2017-11-01 MED ORDER — DEXAMETHASONE SODIUM PHOSPHATE 10 MG/ML IJ SOLN
INTRAMUSCULAR | Status: AC
  Filled 2017-11-01: qty 1

## 2017-11-01 MED ORDER — 0.9 % SODIUM CHLORIDE (POUR BTL) OPTIME
TOPICAL | Status: DC | PRN
  Administered 2017-11-01: 1000 mL

## 2017-11-01 MED ORDER — ROCURONIUM BROMIDE 10 MG/ML (PF) SYRINGE
PREFILLED_SYRINGE | INTRAVENOUS | Status: DC | PRN
  Administered 2017-11-01: 10 mg via INTRAVENOUS
  Administered 2017-11-01: 25 mg via INTRAVENOUS

## 2017-11-01 MED ORDER — SUCCINYLCHOLINE CHLORIDE 200 MG/10ML IV SOSY
PREFILLED_SYRINGE | INTRAVENOUS | Status: AC
  Filled 2017-11-01: qty 10

## 2017-11-01 MED ORDER — LIDOCAINE HCL (CARDIAC) PF 100 MG/5ML IV SOSY
PREFILLED_SYRINGE | INTRAVENOUS | Status: DC | PRN
  Administered 2017-11-01: 60 mg via INTRAVENOUS

## 2017-11-01 MED ORDER — ONDANSETRON HCL 4 MG/2ML IJ SOLN
INTRAMUSCULAR | Status: AC
  Filled 2017-11-01: qty 0

## 2017-11-01 MED ORDER — PROPOFOL 10 MG/ML IV BOLUS
INTRAVENOUS | Status: DC | PRN
  Administered 2017-11-01: 80 mg via INTRAVENOUS

## 2017-11-01 MED ORDER — GABAPENTIN 300 MG PO CAPS
300.0000 mg | ORAL_CAPSULE | ORAL | Status: AC
  Administered 2017-11-01: 300 mg via ORAL
  Filled 2017-11-01: qty 1

## 2017-11-01 MED ORDER — ONDANSETRON HCL 4 MG/2ML IJ SOLN
INTRAMUSCULAR | Status: DC | PRN
  Administered 2017-11-01: 4 mg via INTRAVENOUS

## 2017-11-01 MED ORDER — EPHEDRINE SULFATE-NACL 50-0.9 MG/10ML-% IV SOSY
PREFILLED_SYRINGE | INTRAVENOUS | Status: DC | PRN
  Administered 2017-11-01 (×2): 10 mg via INTRAVENOUS

## 2017-11-01 MED ORDER — CEFAZOLIN SODIUM-DEXTROSE 2-4 GM/100ML-% IV SOLN
2.0000 g | INTRAVENOUS | Status: AC
  Administered 2017-11-01: 2 g via INTRAVENOUS
  Filled 2017-11-01: qty 100

## 2017-11-01 MED ORDER — SUGAMMADEX SODIUM 200 MG/2ML IV SOLN
INTRAVENOUS | Status: DC | PRN
  Administered 2017-11-01: 200 mg via INTRAVENOUS

## 2017-11-01 SURGICAL SUPPLY — 34 items
APPLIER CLIP 5 13 M/L LIGAMAX5 (MISCELLANEOUS) ×2
BLADE CLIPPER SURG (BLADE) IMPLANT
CANISTER SUCT 3000ML PPV (MISCELLANEOUS) ×2 IMPLANT
CHLORAPREP W/TINT 26ML (MISCELLANEOUS) ×2 IMPLANT
CLIP APPLIE 5 13 M/L LIGAMAX5 (MISCELLANEOUS) ×1 IMPLANT
COVER SURGICAL LIGHT HANDLE (MISCELLANEOUS) ×2 IMPLANT
DERMABOND ADVANCED (GAUZE/BANDAGES/DRESSINGS) ×1
DERMABOND ADVANCED .7 DNX12 (GAUZE/BANDAGES/DRESSINGS) ×1 IMPLANT
ELECT REM PT RETURN 9FT ADLT (ELECTROSURGICAL) ×2
ELECTRODE REM PT RTRN 9FT ADLT (ELECTROSURGICAL) ×1 IMPLANT
GLOVE BIO SURGEON STRL SZ 6 (GLOVE) ×2 IMPLANT
GLOVE INDICATOR 6.5 STRL GRN (GLOVE) ×2 IMPLANT
GOWN STRL REUS W/ TWL LRG LVL3 (GOWN DISPOSABLE) ×3 IMPLANT
GOWN STRL REUS W/TWL LRG LVL3 (GOWN DISPOSABLE) ×3
GRASPER SUT TROCAR 14GX15 (MISCELLANEOUS) ×2 IMPLANT
HEMOSTAT SNOW SURGICEL 2X4 (HEMOSTASIS) ×2 IMPLANT
KIT BASIN OR (CUSTOM PROCEDURE TRAY) ×2 IMPLANT
KIT TURNOVER KIT B (KITS) ×2 IMPLANT
NEEDLE INSUFFLATION 14GA 120MM (NEEDLE) ×2 IMPLANT
NS IRRIG 1000ML POUR BTL (IV SOLUTION) ×2 IMPLANT
PAD ARMBOARD 7.5X6 YLW CONV (MISCELLANEOUS) ×2 IMPLANT
POUCH SPECIMEN RETRIEVAL 10MM (ENDOMECHANICALS) ×2 IMPLANT
SCISSORS LAP 5X35 DISP (ENDOMECHANICALS) ×2 IMPLANT
SET IRRIG TUBING LAPAROSCOPIC (IRRIGATION / IRRIGATOR) ×2 IMPLANT
SLEEVE ENDOPATH XCEL 5M (ENDOMECHANICALS) ×4 IMPLANT
SPECIMEN JAR SMALL (MISCELLANEOUS) ×2 IMPLANT
SUT MNCRL AB 4-0 PS2 18 (SUTURE) ×2 IMPLANT
TOWEL OR 17X24 6PK STRL BLUE (TOWEL DISPOSABLE) ×2 IMPLANT
TOWEL OR 17X26 10 PK STRL BLUE (TOWEL DISPOSABLE) IMPLANT
TRAY LAPAROSCOPIC MC (CUSTOM PROCEDURE TRAY) ×2 IMPLANT
TROCAR XCEL NON-BLD 11X100MML (ENDOMECHANICALS) ×2 IMPLANT
TROCAR XCEL NON-BLD 5MMX100MML (ENDOMECHANICALS) ×2 IMPLANT
TUBING INSUFFLATION (TUBING) ×2 IMPLANT
WATER STERILE IRR 1000ML POUR (IV SOLUTION) ×2 IMPLANT

## 2017-11-01 NOTE — Interval H&P Note (Signed)
History and Physical Interval Note:  11/01/2017 2:26 PM  Yolanda MormonElizabeth Hester  has presented today for surgery, with the diagnosis of Gallstone pancreatitis  The various methods of treatment have been discussed with the patient and family. After consideration of risks, benefits and other options for treatment, the patient has consented to  Procedure(s): LAPAROSCOPIC CHOLECYSTECTOMY (N/A) as a surgical intervention .  The patient's history has been reviewed, patient examined, no change in status, stable for surgery.  I have reviewed the patient's chart and labs.  Questions were answered to the patient's satisfaction.     Aleya Durnell Lollie SailsA Saniyyah Elster

## 2017-11-01 NOTE — Discharge Instructions (Signed)
LAPAROSCOPIC SURGERY: POST OP INSTRUCTIONS ° °###################################################################### ° °EAT °Gradually transition to a high fiber diet with a fiber supplement over the next few weeks after discharge.  Start with a pureed / full liquid diet (see below) ° °WALK °Walk an hour a day.  Control your pain to do that.   ° °CONTROL PAIN °Control pain so that you can walk, sleep, tolerate sneezing/coughing, go up/down stairs. ° °HAVE A BOWEL MOVEMENT DAILY °Keep your bowels regular to avoid problems.  OK to try a laxative to override constipation.  OK to use an antidairrheal to slow down diarrhea.  Call if not better after 2 tries ° °CALL IF YOU HAVE PROBLEMS/CONCERNS °Call if you are still struggling despite following these instructions. °Call if you have concerns not answered by these instructions ° °###################################################################### ° ° ° °1. DIET: Follow a light bland diet the first 24 hours after arrival home, such as soup, liquids, crackers, etc.  Be sure to include lots of fluids daily.  Avoid fast food or heavy meals as your are more likely to get nauseated.  Eat a low fat the next few days after surgery.   °2. Take your usually prescribed home medications unless otherwise directed. °3. PAIN CONTROL: °a. Pain is best controlled by a usual combination of three different methods TOGETHER: °i. Ice/Heat °ii. Over the counter pain medication °iii. Prescription pain medication °b. Most patients will experience some swelling and bruising around the incisions.  Ice packs or heating pads (30-60 minutes up to 6 times a day) will help. Use ice for the first few days to help decrease swelling and bruising, then switch to heat to help relax tight/sore spots and speed recovery.  Some people prefer to use ice alone, heat alone, alternating between ice & heat.  Experiment to what works for you.  Swelling and bruising can take several weeks to resolve.   °c. It is  helpful to take an over-the-counter pain medication regularly for the first few weeks.  Choose one of the following that works best for you: °i. Naproxen (Aleve, etc)  Two 220mg tabs twice a day °ii. Ibuprofen (Advil, etc) Three 200mg tabs four times a day (every meal & bedtime) °iii. Acetaminophen (Tylenol, etc) 500-650mg four times a day (every meal & bedtime) °d. A  prescription for pain medication (such as oxycodone, hydrocodone, etc) should be given to you upon discharge.  Take your pain medication as prescribed.  °i. If you are having problems/concerns with the prescription medicine (does not control pain, nausea, vomiting, rash, itching, etc), please call us [phone] to see if we need to switch you to a different pain medicine that will work better for you and/or control your side effect better. °ii. If you need a refill on your pain medication, please contact your pharmacy.  They will contact our office to request authorization. Prescriptions will not be filled after 5 pm or on week-ends. °4. Avoid getting constipated.  Between the surgery and the pain medications, it is common to experience some constipation.  Increasing fluid intake and taking a fiber supplement (such as Metamucil, Citrucel, FiberCon, MiraLax, etc) 1-2 times a day regularly will usually help prevent this problem from occurring.  A mild laxative (prune juice, Milk of Magnesia, MiraLax, etc) should be taken according to package directions if there are no bowel movements after 48 hours.   °5. Watch out for diarrhea.  If you have many loose bowel movements, simplify your diet to bland foods & liquids for   a few days.  Stop any stool softeners and decrease your fiber supplement.  Switching to mild anti-diarrheal medications (Kayopectate, Pepto Bismol) can help.  If this worsens or does not improve, please call us. °6. Wash / shower every day.  You may shower over the skin glue which is waterproof.  Continue to shower over incision(s) after  the dressing is off. °7. Skin glue will flake off after about 2 weeks.  You may leave the incision open to air.  You may replace a dressing/Band-Aid to cover the incision for comfort if you wish.  °8. ACTIVITIES as tolerated:   °a. You may resume regular (light) daily activities beginning the next day--such as daily self-care, walking, climbing stairs--gradually increasing activities as tolerated.  If you can walk 30 minutes without difficulty, it is safe to try more intense activity such as jogging, treadmill, bicycling, low-impact aerobics, swimming, etc. °b. Save the most intensive and strenuous activity for last such as sit-ups, heavy lifting, contact sports, etc  Refrain from any heavy lifting or straining until you are off narcotics for pain control.   °c. DO NOT PUSH THROUGH PAIN.  Let pain be your guide: If it hurts to do something, don't do it.  Pain is your body warning you to avoid that activity for another week until the pain goes down. °d. You may drive when you are no longer taking prescription pain medication, you can comfortably wear a seatbelt, and you can safely maneuver your car and apply brakes. °e. You may have sexual intercourse when it is comfortable.  °9. FOLLOW UP in our office °a. Please call [facility] at [phone] to set up an appointment to see your surgeon in the office for a follow-up appointment approximately 2-3 weeks after your surgery. °b. Make sure that you call for this appointment the day you arrive home to insure a convenient appointment time. °10. IF YOU HAVE DISABILITY OR FAMILY LEAVE FORMS, BRING THEM TO THE OFFICE FOR PROCESSING.  DO NOT GIVE THEM TO YOUR DOCTOR. ° ° °WHEN TO CALL US [phone]: °1. Poor pain control °2. Reactions / problems with new medications (rash/itching, nausea, etc)  °3. Fever over 101.5 F (38.5 C) °4. Inability to urinate °5. Nausea and/or vomiting °6. Worsening swelling or bruising °7. Continued bleeding from incision. °8. Increased pain,  redness, or drainage from the incision ° ° The clinic staff is available to answer your questions during regular business hours (8:30am-5pm).  Please don’t hesitate to call and ask to speak to one of our nurses for clinical concerns.  ° If you have a medical emergency, go to the nearest emergency room or call 911. ° A surgeon from [facility] is always on call at the hospitals ° ° °[facility] °[address], [address], [place], [place]  [address] ? °MAIN: [phone] ? TOLL FREE: [phone] ?  °FAX [phone] °[email or web address] ° ° °

## 2017-11-01 NOTE — Anesthesia Procedure Notes (Signed)
Procedure Name: Intubation Date/Time: 11/01/2017 3:04 PM Performed by: Raenette Rover, CRNA Pre-anesthesia Checklist: Patient identified, Emergency Drugs available, Suction available, Patient being monitored and Timeout performed Patient Re-evaluated:Patient Re-evaluated prior to induction Oxygen Delivery Method: Circle system utilized Preoxygenation: Pre-oxygenation with 100% oxygen Induction Type: IV induction Ventilation: Mask ventilation without difficulty Laryngoscope Size: Mac and 3 Grade View: Grade I Tube type: Oral Tube size: 7.0 mm Number of attempts: 1 Airway Equipment and Method: Stylet Placement Confirmation: ETT inserted through vocal cords under direct vision,  positive ETCO2 and breath sounds checked- equal and bilateral Secured at: 21 cm Tube secured with: Tape Dental Injury: Teeth and Oropharynx as per pre-operative assessment

## 2017-11-01 NOTE — Transfer of Care (Signed)
Immediate Anesthesia Transfer of Care Note  Patient: Yolanda Hester  Procedure(s) Performed: LAPAROSCOPIC CHOLECYSTECTOMY (N/A Abdomen)  Patient Location: PACU  Anesthesia Type:General  Level of Consciousness: awake, alert , oriented, drowsy and patient cooperative  Airway & Oxygen Therapy: Patient Spontanous Breathing and Patient connected to nasal cannula oxygen  Post-op Assessment: Report given to RN and Post -op Vital signs reviewed and stable  Post vital signs: Reviewed and stable  Last Vitals:  Vitals Value Taken Time  BP 145/56 11/01/2017  4:16 PM  Temp    Pulse 64 11/01/2017  4:19 PM  Resp 17 11/01/2017  4:19 PM  SpO2 99 % 11/01/2017  4:19 PM  Vitals shown include unvalidated device data.  Last Pain:  Vitals:   11/01/17 1327  TempSrc:   PainSc: 0-No pain         Complications: No apparent anesthesia complications

## 2017-11-01 NOTE — Anesthesia Preprocedure Evaluation (Addendum)
Anesthesia Evaluation  Patient identified by MRN, date of birth, ID band Patient awake    Reviewed: Allergy & Precautions, NPO status , Patient's Chart, lab work & pertinent test results, reviewed documented beta blocker date and time   Airway Mallampati: II  TM Distance: >3 FB Neck ROM: Full    Dental no notable dental hx. (+) Teeth Intact, Dental Advisory Given   Pulmonary neg pulmonary ROS,    Pulmonary exam normal breath sounds clear to auscultation       Cardiovascular negative cardio ROS Normal cardiovascular exam Rhythm:Regular Rate:Normal     Neuro/Psych negative neurological ROS  negative psych ROS   GI/Hepatic negative GI ROS, Neg liver ROS,   Endo/Other  negative endocrine ROS  Renal/GU negative Renal ROS  negative genitourinary   Musculoskeletal negative musculoskeletal ROS (+) Arthritis , Osteoarthritis,    Abdominal   Peds negative pediatric ROS (+)  Hematology negative hematology ROS (+) anemia ,   Anesthesia Other Findings   Reproductive/Obstetrics negative OB ROS                            Anesthesia Physical Anesthesia Plan  ASA: II  Anesthesia Plan: General   Post-op Pain Management:    Induction: Intravenous  PONV Risk Score and Plan: 3 and Ondansetron, Dexamethasone and Midazolam  Airway Management Planned: Oral ETT  Additional Equipment:   Intra-op Plan:   Post-operative Plan: Extubation in OR  Informed Consent: I have reviewed the patients History and Physical, chart, labs and discussed the procedure including the risks, benefits and alternatives for the proposed anesthesia with the patient or authorized representative who has indicated his/her understanding and acceptance.   Dental advisory given  Plan Discussed with: CRNA, Surgeon and Anesthesiologist  Anesthesia Plan Comments:        Anesthesia Quick Evaluation

## 2017-11-01 NOTE — Op Note (Signed)
Operative Note  Yolanda Hester 84 y.o. female [id number]  11/01/2017  Surgeon: Berna Buehelsea A Deicy Rusk MD  Assistant: none  Procedure performed: Laparoscopic Cholecystectomy  Preop diagnosis: biliary colic, remote history of gallstone pancreatitis Post-op diagnosis/intraop findings: same  Specimens: gallbladder  EBL: minimal  Complications: none  Description of procedure: After obtaining informed consent the patient was brought to the operating room. Prophylactic antibiotics and subcutaneous heparin were administered. SCD's were applied. General endotracheal anesthesia was initiated and a formal time-out was performed. The abdomen was prepped and draped in the usual sterile fashion and the abdomen was entered using an infraumbilical veress needle after instilling the site with local. Insufflation to 15mmHg was obtained, 5mm trocar and camera inserted and gross inspection revealed no evidence of injury from our entry or other intraabdominal abnormalities. There are omental adhesions from the umbilicus down towards the midline and right side, the underlying bowel was inspected and confirmed no injury or bleeding.  Two 5mm trocars were introduced in the right midclavicular and right anterior axillary lines under direct visualization and following infiltration with local. An 11mm trocar was placed in the epigastrium. The gallbladder was retracted cephalad and the infundibulum was retracted laterally. A combination of hook electrocautery and blunt dissection was utilized to clear the peritoneum from the neck and cystic duct, circumferentially isolating the cystic artery and cystic duct and lifting the gallbladder from the cystic plate. The critical view of safety was achieved with the cystic artery, cystic duct, and liver bed visualized between them with no other structures. The artery was clipped with two clips proximally and one distally and divided as was the cystic duct with three clips on the proximal  end. The gallbladder was dissected from the liver plate using electrocautery. Once freed the gallbladder was placed in an endocatch bag and removed intact through the epigastric trocar site. A small amount of bleeding on the liver bed was controlled with cautery and surgicel Jamelle HaringSnow was left in the liver bed. This was aspirated and the right upper quadrant was irrigated and the effluent was clear. Hemostasis was once again confirmed, and reinspection of the abdomen revealed no injuries. The clips were well opposed without any bile leak from the duct or the liver bed. The 11mm trocar site in the epigastrium was closed with a 0 vicryl in the fascia under direct visualization using a PMI device. The abdomen was desufflated and all trocars removed. The skin incisions were closed with running subcuticular monocryl and Dermabond. The patient was awakened, extubated and transported to the recovery room in stable condition.   All counts were correct at the completion of the case.

## 2017-11-01 NOTE — Anesthesia Postprocedure Evaluation (Signed)
Anesthesia Post Note  Patient: Yolanda Hester  Procedure(s) Performed: LAPAROSCOPIC CHOLECYSTECTOMY (N/A Abdomen)     Patient location during evaluation: PACU Anesthesia Type: General Level of consciousness: awake and alert Pain management: pain level controlled Vital Signs Assessment: post-procedure vital signs reviewed and stable Respiratory status: spontaneous breathing, nonlabored ventilation and respiratory function stable Cardiovascular status: blood pressure returned to baseline and stable Postop Assessment: no apparent nausea or vomiting Anesthetic complications: no    Last Vitals:  Vitals:   11/01/17 1654 11/01/17 1723  BP: (!) 150/72 (!) 155/78  Pulse: 85 82  Resp: 12 14  Temp:    SpO2: 99% 98%    Last Pain:  Vitals:   11/01/17 1723  TempSrc:   PainSc: 0-No pain                 Lowella CurbWarren Ray Shanyah Gattuso

## 2017-11-02 ENCOUNTER — Encounter (HOSPITAL_COMMUNITY): Payer: Self-pay | Admitting: Surgery

## 2020-01-05 IMAGING — NM NM HEPATO W/GB/PHARM/[PERSON_NAME]
2 series · 7 of 7 positions shown · non-contrast
Comparison: None.

CLINICAL DATA: Nausea, vomiting, and abdominal pain.

EXAM:
NUCLEAR MEDICINE HEPATOBILIARY IMAGING WITH GALLBLADDER EF
TECHNIQUE: Sequential images of the abdomen were obtained [DATE] minutes
following intravenous administration of radiopharmaceutical. After
oral ingestion of Ensure, gallbladder ejection fraction was
determined. At 60 min, normal ejection fraction is greater than 33%.
RADIOPHARMACEUTICALS:  4.9 mCi 8c-GGm  Choletec IV

[he hepatobiliary · 2.26mm/px · 1 of 1 slices shown (1 of 2)]
[im 1/1]
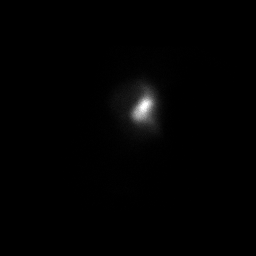

[he hepatobiliary · 4.52mm/px · 6 of 60 frames shown (2 of 2)]
[frame 6/60]
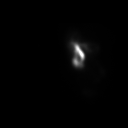
[frame 16/60]
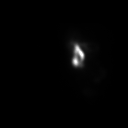
[frame 26/60]
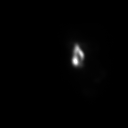
[frame 36/60]
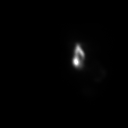
[frame 46/60]
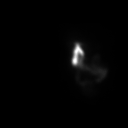
[frame 56/60]
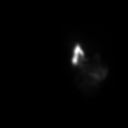

[7 of 7 positions shown; findings below may reference images not displayed]

FINDINGS: Prompt uptake and biliary excretion of activity by the liver is
seen. Gallbladder activity is visualized, consistent with patency of
cystic duct. Biliary activity passes into small bowel, consistent
with patent common bile duct.

Calculated gallbladder ejection fraction is 65%. (Normal gallbladder
ejection fraction with Ensure is greater than 33%.)
IMPRESSION: Normal gallbladder ejection fraction.
